# Patient Record
Sex: Female | Born: 1995 | Race: Black or African American | Hispanic: No | Marital: Single | Smoking: Never smoker
Health system: Southern US, Community
[De-identification: ages and names within clinical notes are randomized; demographics above are authoritative.]

## PROBLEM LIST (undated history)

## (undated) DIAGNOSIS — F419 Anxiety disorder, unspecified: Secondary | ICD-10-CM

## (undated) DIAGNOSIS — H539 Unspecified visual disturbance: Secondary | ICD-10-CM

## (undated) HISTORY — PX: OTHER SURGICAL HISTORY: SHX169

---
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
## 2016-07-22 ENCOUNTER — Other Ambulatory Visit (HOSPITAL_COMMUNITY): Payer: Self-pay | Admitting: Psychiatry

## 2023-04-21 ENCOUNTER — Emergency Department (HOSPITAL_COMMUNITY)
Admission: EM | Admit: 2023-04-21 | Discharge: 2023-04-21 | Disposition: A | Discharge status: Home / Self Care | Payer: Self-pay | Attending: Emergency Medicine | Admitting: Emergency Medicine

## 2023-04-21 ENCOUNTER — Other Ambulatory Visit: Payer: Self-pay

## 2023-04-21 ENCOUNTER — Encounter (HOSPITAL_COMMUNITY): Payer: Self-pay

## 2023-04-21 DIAGNOSIS — L989 Disorder of the skin and subcutaneous tissue, unspecified: Secondary | ICD-10-CM | POA: Insufficient documentation

## 2023-04-21 NOTE — ED Notes (Signed)
Pt states that the area around her mole has gotten sore the last couple of days. Pt states the mole has changed color and size.

## 2023-04-21 NOTE — ED Provider Notes (Signed)
[place] EMERGENCY DEPARTMENT AT [facility] Provider Note   CSN: [id number] Arrival date & time: 04/21/23  1141     History Chief Complaint  Patient presents with   Mass    Alyssa Nunez is a 27 y.o. female patient who presents to the ED for further evaluation of a skin lesion.  Patient states she has had the skin lesion her entire life but over the last couple months it is grown a large amount in size and is now painful.  She also states that it is pruritic at times.  She denies any fever, chills, drainage.  HPI     Home Medications Prior to Admission medications   Medication Sig Start Date End Date Taking? Authorizing Provider  cephALEXin (KEFLEX) 500 MG capsule Take 1 capsule (500 mg total) by mouth 4 (four) times daily. 04/17/14   Elson Areas, PA-C  ibuprofen (ADVIL,MOTRIN) 800 MG tablet Take 1 tablet (800 mg total) by mouth 3 (three) times daily. 04/17/14   Elson Areas, PA-C      Allergies    Patient has no known allergies.    Review of Systems   Review of Systems  All other systems reviewed and are negative.   Physical Exam Updated Vital Signs BP (!) 153/98 (BP Location: Right Arm)   Pulse 91   Temp 98.6 F (37 C)   Resp 16   Ht 5\' 7"  (1.702 m)   Wt 50.8 kg   SpO2 97%   BMI 17.54 kg/m  Physical Exam Vitals and nursing note reviewed.  Constitutional:      Appearance: Normal appearance.  HENT:     Head: Normocephalic and atraumatic.  Eyes:     General:        Right eye: No discharge.        Left eye: No discharge.     Conjunctiva/sclera: Conjunctivae normal.  Pulmonary:     Effort: Pulmonary effort is normal.  Skin:    General: Skin is warm and dry.     Findings: No rash.     Comments: There is a 2.5 centimeter raised with irregular borders and multicolored lesion to the scalp.  Neurological:     General: No focal deficit present.     Mental Status: She is alert.  Psychiatric:        Mood and Affect: Mood normal.         Behavior: Behavior normal.      ED Results / Procedures / Treatments   Labs (all labs ordered are listed, but only abnormal results are displayed) Labs Reviewed - No data to display  EKG None  Radiology No results found.  Procedures Procedures   Medications Ordered in ED Medications - No data to display  ED Course/ Medical Decision Making/ A&P   {   Click here for ABCD2, HEART and other calculators  Medical Decision Making Alyssa Nunez is a 27 y.o. female patient who presents to the emergency department today for further evaluation of a skin lesion.  Given the size, shape, color, and borders I am concerned for possible malignancy.  Patient has been calling around and has not been able to get an appointment before February.  I contacted [facility] and spoke with the front desk to see if we can get the patient in for biopsy.  Fortunately, I had both of the dermatologist that were in the clinic today look at a picture in the patient's chart and they  scheduled her a biopsy for Tuesday.  Patient has an appointment scheduled.  Ambulatory referral given.  Strict return precautions were given.  She is safer discharge.     Final Clinical Impression(s) / ED Diagnoses Final diagnoses:  Skin lesion    Rx / DC Orders ED Discharge Orders          Ordered    Ambulatory referral to Dermatology        04/21/23 1449              Teressa Lower, Cordelia Poche 04/21/23 1500    Rondel Baton, MD 04/23/23 1659

## 2023-04-21 NOTE — ED Triage Notes (Signed)
Mole on scalp Growing over the last couple of years Painful over the last couple of days  Itching and sore.

## 2023-04-21 NOTE — Discharge Instructions (Signed)
Please follow-up on Tuesday for your scheduled appointment.  You may return to the emergency department for any worsening symptoms.

## 2023-04-26 ENCOUNTER — Ambulatory Visit (INDEPENDENT_AMBULATORY_CARE_PROVIDER_SITE_OTHER): Payer: Self-pay | Admitting: Dermatology

## 2023-04-26 ENCOUNTER — Encounter: Payer: Self-pay | Admitting: Dermatology

## 2023-04-26 VITALS — BP 114/77 | HR 100

## 2023-04-26 DIAGNOSIS — D229 Melanocytic nevi, unspecified: Secondary | ICD-10-CM

## 2023-04-26 DIAGNOSIS — D485 Neoplasm of uncertain behavior of skin: Secondary | ICD-10-CM

## 2023-04-26 DIAGNOSIS — D492 Neoplasm of unspecified behavior of bone, soft tissue, and skin: Secondary | ICD-10-CM

## 2023-04-26 DIAGNOSIS — D2230 Melanocytic nevi of unspecified part of face: Secondary | ICD-10-CM

## 2023-04-26 DIAGNOSIS — D224 Melanocytic nevi of scalp and neck: Secondary | ICD-10-CM

## 2023-04-26 NOTE — Progress Notes (Signed)
   New Patient Visit   Subjective  Alyssa Nunez is a 27 y.o. female who presents for the following: growth on scalp, present for several years, grown more quickly recently. Irritated after she had her hair braided.  She is accompanied by her daughter and mother.  Pt has growth on her head that has been there her entire life but recently seems to have grown and gotten tingly  The following portions of the chart were reviewed this encounter and updated as appropriate: medications, allergies, medical history  Review of Systems:  No other skin or systemic complaints except as noted in HPI or Assessment and Plan.  Objective  Well appearing patient in no apparent distress; mood and affect are within normal limits.   A focused examination was performed of the following areas: head  Relevant exam findings are noted in the Assessment and Plan.  Left Frontal Scalp Pedunculated, pink brown plaque       Assessment & Plan    Neoplasm of uncertain behavior of skin Left Frontal Scalp  Skin / nail biopsy Type of biopsy: tangential   Informed consent: discussed and consent obtained   Timeout: patient name, date of birth, surgical site, and procedure verified   Procedure prep:  Patient was prepped and draped in usual sterile fashion Prep type:  Isopropyl alcohol Anesthesia: the lesion was anesthetized in a standard fashion   Anesthetic:  1% lidocaine w/ epinephrine 1-100,000 buffered w/ 8.4% NaHCO3 Instrument used: DermaBlade   Hemostasis achieved with: aluminum chloride   Outcome: patient tolerated procedure well   Post-procedure details: sterile dressing applied and wound care instructions given   Dressing type: petrolatum and bandage    MELANOCYTIC NEVI-face Exam: Tan-brown and/or pink-flesh-colored symmetric macules and papules  Treatment Plan: Benign appearing on exam today. Recommend observation. Call clinic for new or changing moles. Recommend daily use of broad  spectrum spf 30+ sunscreen to sun-exposed areas.   Return if symptoms worsen or fail to improve.  I, Tillie Fantasia, CMA, am acting as scribe for Gwenith Daily, MD.   Documentation: I have reviewed the above documentation for accuracy and completeness, and I agree with the above.  Gwenith Daily, MD

## 2023-04-26 NOTE — Patient Instructions (Addendum)
Plan: Counseling I counseled the patient regarding the following: Instructions: Neoplasms of Uncertain Behavior can be observed, biopsied or surgically removed depending on the level of clinical suspicion.  Patient Handout: Wound Care for Skin Biopsy Site  Taking Care of Your Skin Biopsy Site  Proper care of the biopsy site is essential for promoting healing and minimizing scarring. This handout provides instructions on how to care for your biopsy site to ensure optimal recovery.  1. Cleaning the Wound:  Clean the biopsy site daily with gentle soap and water. Gently pat the area dry with a clean, soft towel. Avoid harsh scrubbing or rubbing the area, as this can irritate the skin and delay healing.  2. Applying Aquaphor and Bandage:  After cleaning the wound, apply a thin layer of Aquaphor ointment to the biopsy site. Cover the area with a sterile bandage to protect it from dirt, bacteria, and friction. Change the bandage daily or as needed if it becomes soiled or wet.  3. Continued Care for One Week:  Repeat the cleaning, Aquaphor application, and bandaging process daily for one week following the biopsy procedure. Keeping the wound clean and moist during this initial healing period will help prevent infection and promote optimal healing.  4. Massaging Aquaphor into the Area:  ---After one week, discontinue the use of bandages but continue to apply Aquaphor to the biopsy site. ----Gently massage the Aquaphor into the area using circular motions. ---Massaging the skin helps to promote circulation and prevent the formation of scar tissue.   Additional Tips:  Avoid exposing the biopsy site to direct sunlight during the healing process, as this can cause hyperpigmentation or worsen scarring. If you experience any signs of infection, such as increased redness, swelling, warmth, or drainage from the wound, contact your healthcare provider immediately. Follow any additional  instructions provided by your healthcare provider for caring for the biopsy site and managing any discomfort. Conclusion:  Taking proper care of your skin biopsy site is crucial for ensuring optimal healing and minimizing scarring. By following these instructions for cleaning, applying Aquaphor, and massaging the area, you can promote a smooth and successful recovery. If you have any questions or concerns about caring for your biopsy site, don't hesitate to contact your healthcare provider for guidance.  Important Information   Due to recent changes in healthcare laws, you may see results of your pathology and/or laboratory studies on MyChart before the doctors have had a chance to review them. We understand that in some cases there may be results that are confusing or concerning to you. Please understand that not all results are received at the same time and often the doctors may need to interpret multiple results in order to provide you with the best plan of care or course of treatment. Therefore, we ask that you please give [place] 2 business days to thoroughly review all your results before contacting the office for clarification. Should we see a critical lab result, you will be contacted sooner.     If You Need Anything After Your Visit   If you have any questions or concerns for your doctor, please call our main line at [phone]. If no one answers, please leave a voicemail as directed and we will return your call as soon as possible. Messages left after 4 pm will be answered the following business day.    You may also send [place] a message via MyChart. We typically respond to MyChart messages within 1-2 business days.  For prescription refills,  please ask your pharmacy to contact our office. Our fax number is [phone].  If you have an urgent issue when the clinic is closed that cannot wait until the next business day, you can page your doctor at the number below.     Please note that while we  do our best to be available for urgent issues outside of office hours, we are not available 24/7.    If you have an urgent issue and are unable to reach [place], you may choose to seek medical care at your doctor's office, retail clinic, urgent care center, or emergency room.   If you have a medical emergency, please immediately call 911 or go to the emergency department. In the event of inclement weather, please call our main line at [phone] for an update on the status of any delays or closures.  Dermatology Medication Tips: Please keep the boxes that topical medications come in in order to help keep track of the instructions about where and how to use these. Pharmacies typically print the medication instructions only on the boxes and not directly on the medication tubes.   If your medication is too expensive, please contact our office at [phone] or send [place] a message through MyChart.    We are unable to tell what your co-pay for medications will be in advance as this is different depending on your insurance coverage. However, we may be able to find a substitute medication at lower cost or fill out paperwork to get insurance to cover a needed medication.    If a prior authorization is required to get your medication covered by your insurance company, please allow [place] 1-2 business days to complete this process.   Drug prices often vary depending on where the prescription is filled and some pharmacies may offer cheaper prices.   The website www.goodrx.com contains coupons for medications through different pharmacies. The prices here do not account for what the cost may be with help from insurance (it may be cheaper with your insurance), but the website can give you the price if you did not use any insurance.  - You can print the associated coupon and take it with your prescription to the pharmacy.  - You may also stop by our office during regular business hours and pick up a GoodRx coupon card.   - If you need your prescription sent electronically to a different pharmacy, notify our office through [facility] or by phone at [phone]

## 2023-05-02 LAB — SURGICAL PATHOLOGY

## 2023-05-03 ENCOUNTER — Telehealth: Payer: Self-pay

## 2023-05-03 NOTE — Telephone Encounter (Signed)
-----   Message from [facility] sent at 05/02/2023  6:33 PM EST ----- Results: Lesion on scalp results showed benign mole. Reassured patient on benign nature. OK to follow up in 6-12 months  Please let her know.

## 2023-05-03 NOTE — Telephone Encounter (Signed)
Spoke with pt and gave her biopsy results

## 2023-10-20 ENCOUNTER — Encounter: Payer: Self-pay | Admitting: Adult Health

## 2024-06-01 ENCOUNTER — Ambulatory Visit: Payer: Self-pay

## 2024-07-23 ENCOUNTER — Encounter: Payer: Self-pay | Admitting: Obstetrics & Gynecology
